# Patient Record
Sex: Female | Born: 1975 | Race: White | Hispanic: No | Marital: Married | Smoking: Never smoker
Health system: Southern US, Community
[De-identification: ages and names within clinical notes are randomized; demographics above are authoritative.]

## PROBLEM LIST (undated history)

## (undated) HISTORY — PX: BREAST REDUCTION SURGERY: SHX8

## (undated) HISTORY — PX: CHOLECYSTECTOMY: SHX55

## (undated) HISTORY — PX: OTHER SURGICAL HISTORY: SHX169

---

## 2004-10-21 ENCOUNTER — Inpatient Hospital Stay: Payer: Self-pay | Admitting: General Surgery

## 2006-02-07 ENCOUNTER — Ambulatory Visit: Payer: Self-pay | Admitting: Family Medicine

## 2006-11-22 IMAGING — US ABDOMEN ULTRASOUND
1 series · 17 of 25 positions shown · non-contrast
Comparison: none

REASON FOR EXAM: epigastric pain      rm 14
COMMENTS:

[Series 1: abdomen ultrasound · 17 of 64 slices shown]
[im 1/64]
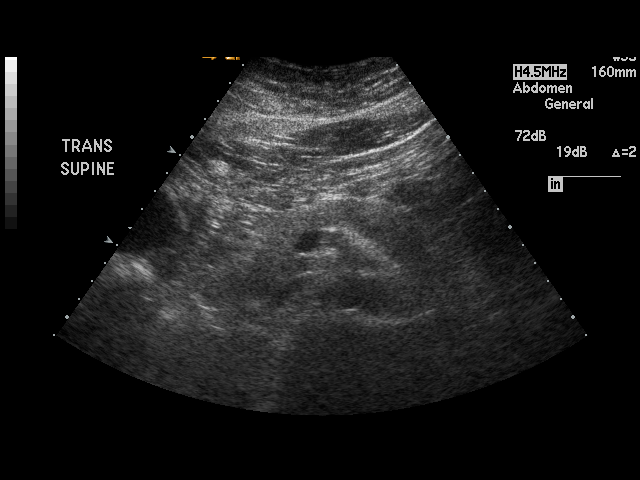
[im 6/64]
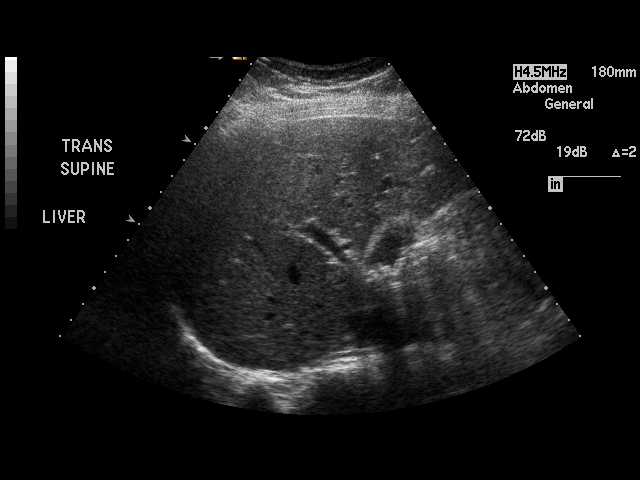
[im 8/64]
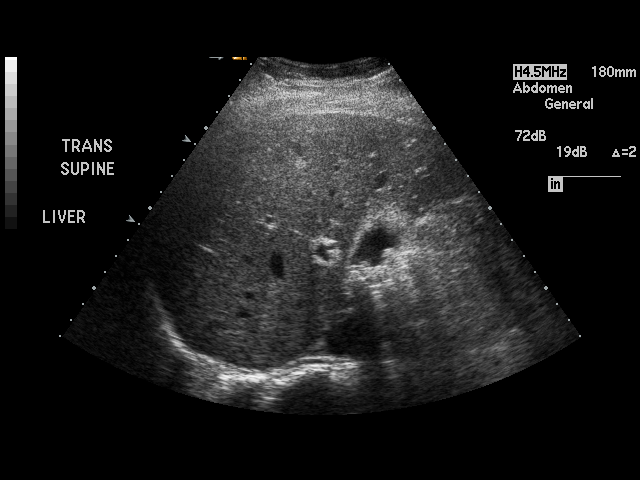
[im 14/64]
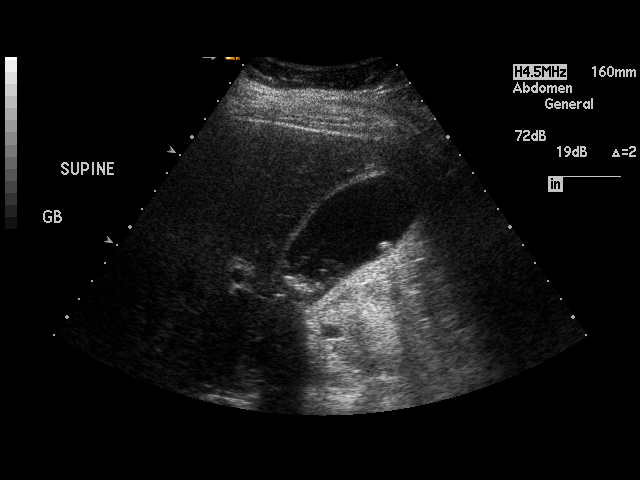
[im 16/64]
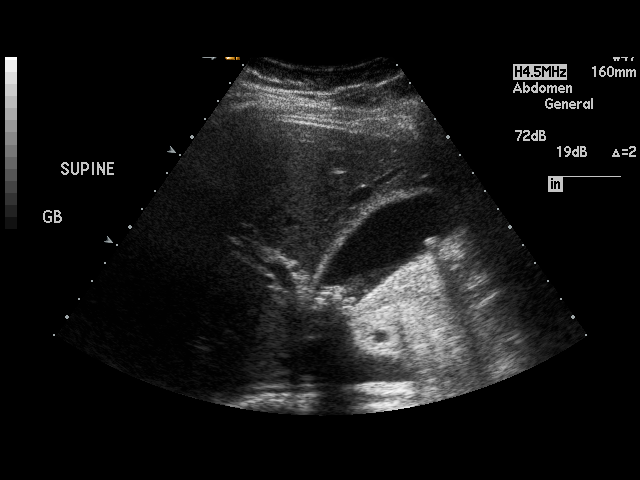
[im 22/64]
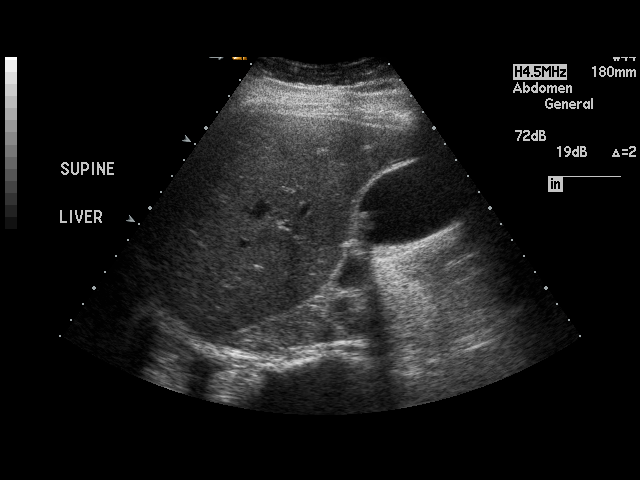
[im 24/64]
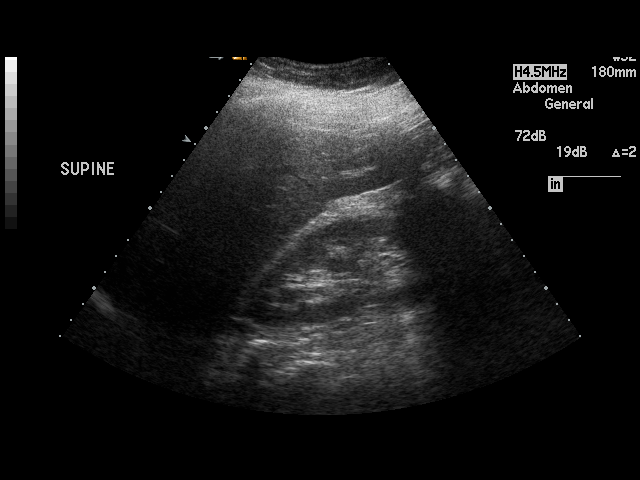
[im 29/64]
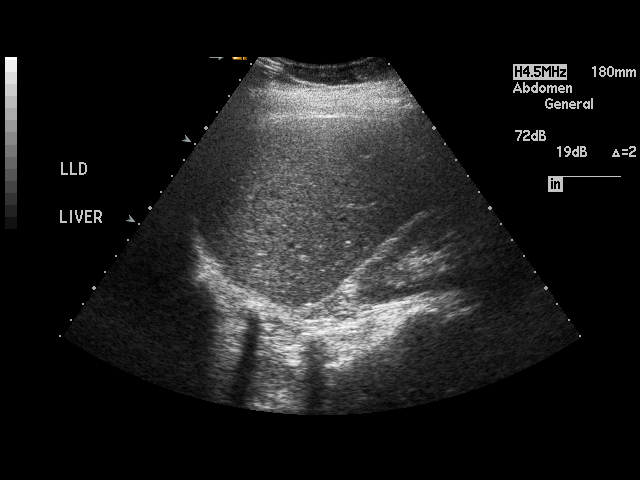
[im 32/64]
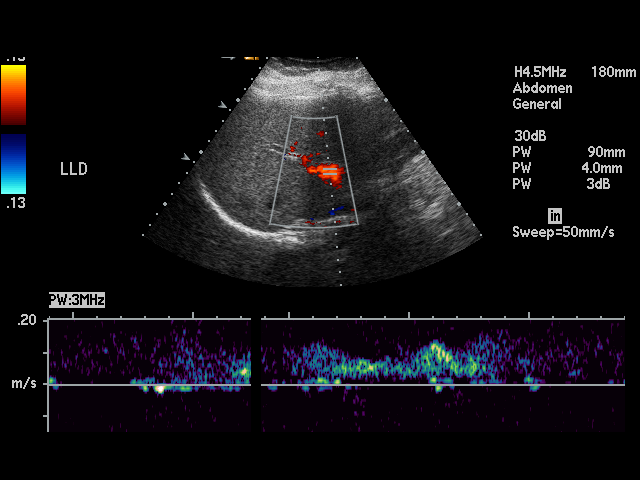
[im 35/64]
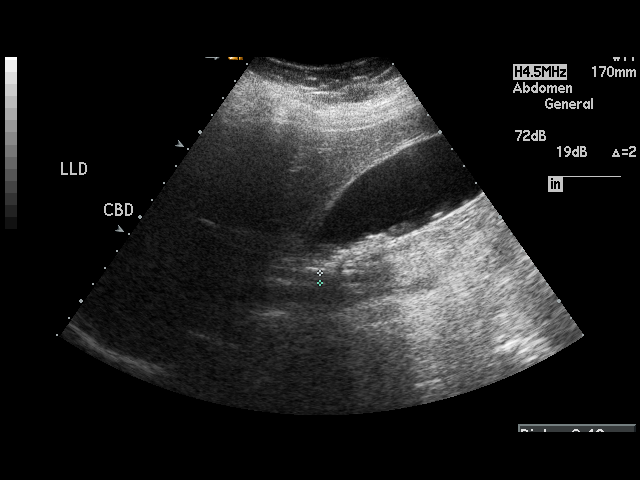
[im 40/64]
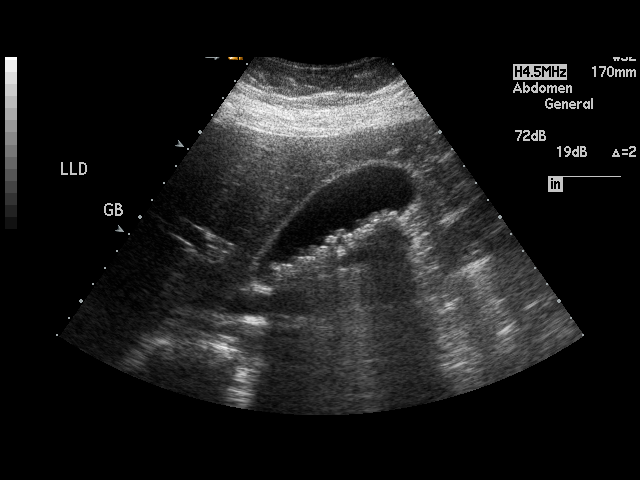
[im 43/64]
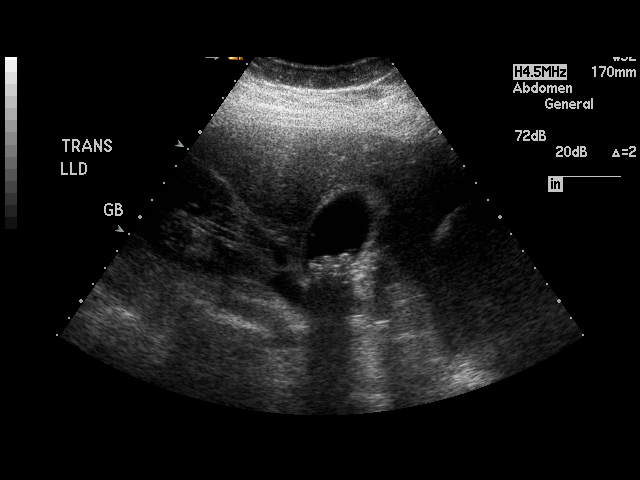
[im 48/64]
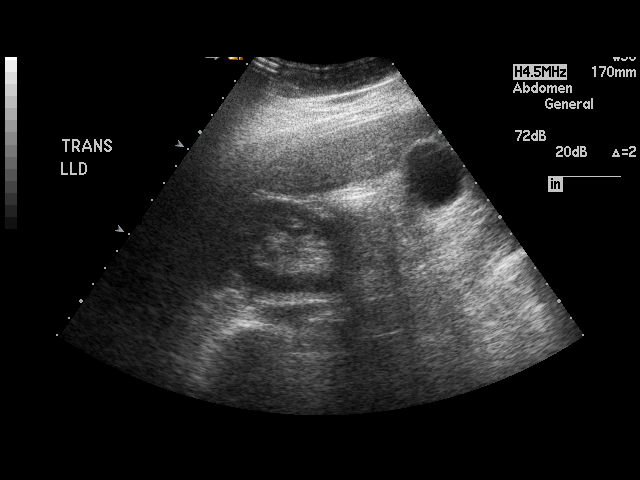
[im 50/64]
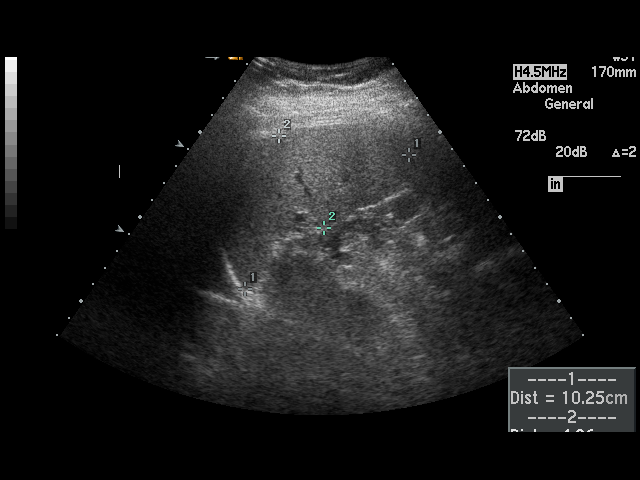
[im 56/64]
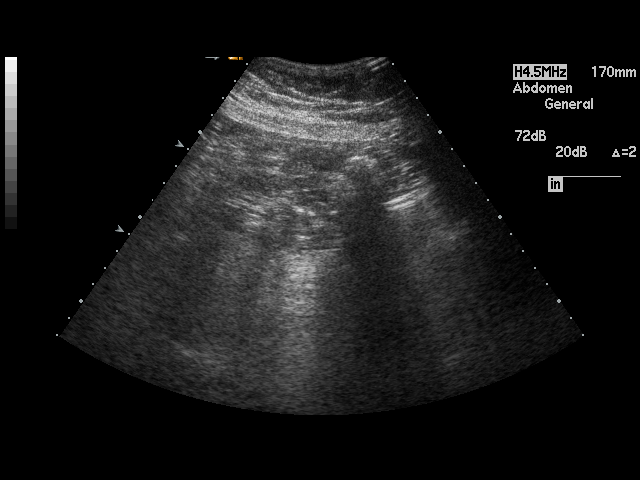
[im 58/64]
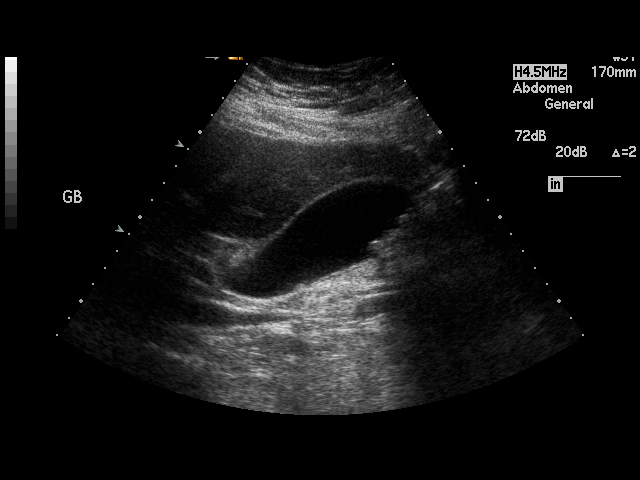
[im 64/64]
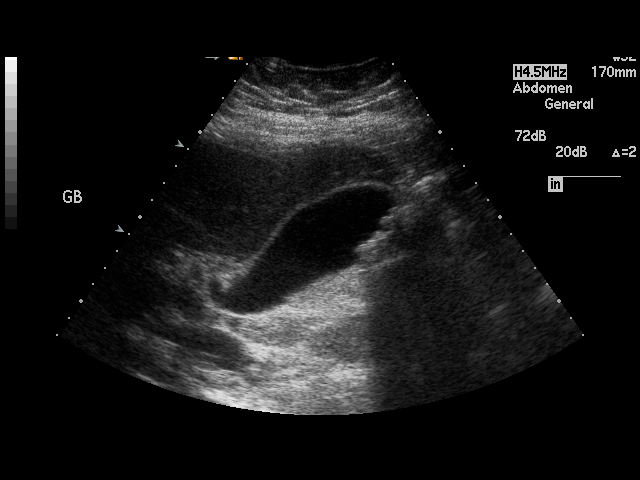

[17 of 25 positions shown; findings below may reference images not displayed]

PROCEDURE:     US  - US ABDOMEN GENERAL SURVEY  - October 21, 2004  [DATE]

RESULT:     The liver, spleen and pancreas are normal in appearance. There
are multiple echo densities in the gallbladder compatible with gallstones.
The gallbladder wall is upper limits to normal in size and thickness or
slightly thickened. The common bile duct measures 4.8 mm in diameter which
is within normal limits. The kidneys show no hydronephrosis. There is no
ascites.
IMPRESSION: 1)Cholelithiasis.

## 2008-01-17 ENCOUNTER — Ambulatory Visit: Payer: Self-pay | Admitting: Family Medicine

## 2008-01-17 DIAGNOSIS — K644 Residual hemorrhoidal skin tags: Secondary | ICD-10-CM | POA: Insufficient documentation

## 2008-01-17 DIAGNOSIS — J309 Allergic rhinitis, unspecified: Secondary | ICD-10-CM | POA: Insufficient documentation

## 2008-07-03 ENCOUNTER — Telehealth (INDEPENDENT_AMBULATORY_CARE_PROVIDER_SITE_OTHER): Payer: Self-pay | Admitting: *Deleted

## 2008-09-23 ENCOUNTER — Ambulatory Visit: Payer: Self-pay | Admitting: Family Medicine

## 2008-09-25 ENCOUNTER — Telehealth: Payer: Self-pay | Admitting: Family Medicine

## 2009-03-12 ENCOUNTER — Telehealth (INDEPENDENT_AMBULATORY_CARE_PROVIDER_SITE_OTHER): Payer: Self-pay | Admitting: Internal Medicine

## 2009-11-20 ENCOUNTER — Encounter: Payer: Self-pay | Admitting: Family Medicine

## 2010-08-31 ENCOUNTER — Ambulatory Visit: Payer: Self-pay | Admitting: Obstetrics and Gynecology

## 2010-09-03 ENCOUNTER — Ambulatory Visit: Payer: Self-pay | Admitting: Obstetrics and Gynecology

## 2010-09-21 NOTE — Letter (Signed)
Summary: [facility]  [facility]   Imported By: Maryln Gottron 11/25/2009 15:47:53  _____________________________________________________________________  External Attachment:    Type:   Image     Comment:   External Document

## 2016-05-19 ENCOUNTER — Ambulatory Visit (INDEPENDENT_AMBULATORY_CARE_PROVIDER_SITE_OTHER): Payer: BLUE CROSS/BLUE SHIELD | Admitting: Podiatry

## 2016-05-19 ENCOUNTER — Encounter: Payer: Self-pay | Admitting: Podiatry

## 2016-05-19 ENCOUNTER — Ambulatory Visit (INDEPENDENT_AMBULATORY_CARE_PROVIDER_SITE_OTHER): Payer: BLUE CROSS/BLUE SHIELD

## 2016-05-19 VITALS — Ht 68.0 in | Wt 188.0 lb

## 2016-05-19 DIAGNOSIS — M2040 Other hammer toe(s) (acquired), unspecified foot: Secondary | ICD-10-CM

## 2016-05-19 DIAGNOSIS — M79675 Pain in left toe(s): Secondary | ICD-10-CM | POA: Diagnosis not present

## 2016-05-19 DIAGNOSIS — R52 Pain, unspecified: Secondary | ICD-10-CM | POA: Diagnosis not present

## 2016-05-19 DIAGNOSIS — M79674 Pain in right toe(s): Secondary | ICD-10-CM | POA: Diagnosis not present

## 2016-05-19 NOTE — Progress Notes (Signed)
   Subjective:    Patient ID: Alexandra KeenMelissa E Garno, female    DOB: Sep 19, 1975, 40 y.o.   MRN: [id number]  HPI    Review of Systems  HENT: Positive for hearing loss.   Allergic/Immunologic: Positive for environmental allergies.       Objective:   Physical Exam        Assessment & Plan:

## 2016-05-22 NOTE — Progress Notes (Signed)
Patient ID: Alexandra Stephenson, female   DOB: 02-22-1976, 40 y.o.   MRN: [id number] Subjective:  40 year old female patient presents today for evaluation of elongated digits with respect of pain to the second digits bilaterally. Patient states that she is always had pain in her second toes. Patient presents today for further treatment and possible surgical intervention and management.    Objective: Physical Exam General: The patient is alert and oriented x3 in no acute distress.  Dermatology: Skin is warm, dry and supple bilateral lower extremities. Negative for open lesions or macerations.  Vascular: Palpable pedal pulses bilaterally. No edema or erythema noted. Capillary refill within normal limits.  Neurological: Epicritic and protective threshold grossly intact bilaterally.   Musculoskeletal Exam: Elongated second digits noted bilaterally protruding past the normal parabola. Pain on palpation to the second digits distally. Hammertoe digit contracture noted to the second digits bilaterally.  Range of motion within normal limits to all pedal and ankle joints bilateral. Muscle strength 5/5 in all groups bilateral.   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 Normal osseous mineralization. Joint spaces preserved. No fracture/dislocation/boney destruction.     Assessment: #1 pain in bilateral toes second digits #2 elongated second digits #3 hammertoe deformity second digits bilateral #4 prominent navicular bilateral, more symptomatic on the right foot  Plan of Care:  #1 Patient was evaluated. #2 discussed the conservative versus surgical management of the elongated second digits of the respective pain and hammertoe deformities. #3 at this time the patient is to consider the surgical option, which would include hammertoe correction with shortening of the second digits bilaterally. #4 patient is to return to clinic on a when necessary  basis  Patient is a pharmacist at [employer]     Dr. Felecia ShellingBrent M. Harrie Cazarez, DPM [facility]

## 2016-06-15 ENCOUNTER — Encounter: Payer: Self-pay | Admitting: Podiatry

## 2016-06-16 ENCOUNTER — Telehealth (INDEPENDENT_AMBULATORY_CARE_PROVIDER_SITE_OTHER): Payer: Self-pay

## 2016-06-16 NOTE — Telephone Encounter (Signed)
Patient called wanting to know more about a procedure that Dr. Wyn Quakerew discussed with her 4 years ago. Dr. Wyn Quakerew discussed her having laser ablation and foam sclerotherapy, she was wondering if she could possibly get this done next year after her insurance changed. I let her know that we could get her in to see Dr. Wyn Quakerew and go from there to schedule any procedures per what the doctor ordered.

## 2016-06-20 ENCOUNTER — Telehealth: Payer: Self-pay | Admitting: *Deleted

## 2016-06-20 NOTE — Telephone Encounter (Signed)
"  I was contacting you to schedule a surgery with Dr. Logan BoresEvans.  If you would, give me a call back."

## 2016-06-20 NOTE — Telephone Encounter (Signed)
"  I've called several times to schedule my surgery."  I apologize for the wait.  "Oh, it is not a problem.  I want to go ahead and set up surgery for February 15.  I don't know if you are scheduling out that far or not."  Have you signed consent forms?  "No, I guess I am a little confused.  He said for me to call when I decided whether or not I wanted surgery."  I read his note.  He wanted you to schedule an appointment to see him once you decided whether or not you want to proceed.  I can transfer you to a scheduler if you like.  "Is it possible for me to go to the [place] office.  [facility] to get [facility] sooner."  I'll transfer you to a scheduler.

## 2016-07-08 ENCOUNTER — Ambulatory Visit (INDEPENDENT_AMBULATORY_CARE_PROVIDER_SITE_OTHER): Payer: BLUE CROSS/BLUE SHIELD | Admitting: Podiatry

## 2016-07-08 ENCOUNTER — Telehealth: Payer: Self-pay | Admitting: *Deleted

## 2016-07-08 DIAGNOSIS — M79674 Pain in right toe(s): Secondary | ICD-10-CM

## 2016-07-08 DIAGNOSIS — Q742 Other congenital malformations of lower limb(s), including pelvic girdle: Secondary | ICD-10-CM

## 2016-07-08 DIAGNOSIS — R52 Pain, unspecified: Secondary | ICD-10-CM

## 2016-07-08 DIAGNOSIS — M79675 Pain in left toe(s): Secondary | ICD-10-CM

## 2016-07-08 NOTE — Patient Instructions (Signed)
Pre-Operative Instructions  Congratulations, you have decided to take an important step to improving your quality of life.  You can be assured that the doctors of [facility] will be with you every step of the way.  1. Plan to be at the surgery center/hospital at least 1 (one) hour prior to your scheduled time unless otherwise directed by the surgical center/hospital staff.  You must have a responsible adult accompany you, remain during the surgery and drive you home.  Make sure you have directions to the surgical center/hospital and know how to get there on time. 2. For hospital based surgery you will need to obtain a history and physical form from your family physician within 1 month prior to the date of surgery- we will give you a form for you primary physician.  3. We make every effort to accommodate the date you request for surgery.  There are however, times where surgery dates or times have to be moved.  We will contact you as soon as possible if a change in schedule is required.   4. No Aspirin/Ibuprofen for one week before surgery.  If you are on aspirin, any non-steroidal anti-inflammatory medications (Mobic, Aleve, Ibuprofen) you should stop taking it 7 days prior to your surgery.  You make take Tylenol  For pain prior to surgery.  5. Medications- If you are taking daily heart and blood pressure medications, seizure, reflux, allergy, asthma, anxiety, pain or diabetes medications, make sure the surgery center/hospital is aware before the day of surgery so they may notify you which medications to take or avoid the day of surgery. 6. No food or drink after midnight the night before surgery unless directed otherwise by surgical center/hospital staff. 7. No alcoholic beverages 24 hours prior to surgery.  No smoking 24 hours prior to or 24 hours after surgery. 8. Wear loose pants or shorts- loose enough to fit over bandages, boots, and casts. 9. No slip on shoes, sneakers are best. 10. Bring  your boot with you to the surgery center/hospital.  Also bring crutches or a walker if your physician has prescribed it for you.  If you do not have this equipment, it will be provided for you after surgery. 11. If you have not been contracted by the surgery center/hospital by the day before your surgery, call to confirm the date and time of your surgery. 12. Leave-time from work may vary depending on the type of surgery you have.  Appropriate arrangements should be made prior to surgery with your employer. 13. Prescriptions will be provided immediately following surgery by your doctor.  Have these filled as soon as possible after surgery and take the medication as directed. 14. Remove nail polish on the operative foot. 15. Wash the night before surgery.  The night before surgery wash the foot and leg well with the antibacterial soap provided and water paying special attention to beneath the toenails and in between the toes.  Rinse thoroughly with water and dry well with a towel.  Perform this wash unless told not to do so by your physician.  Enclosed: 1 Ice pack (please put in freezer the night before surgery)   1 Hibiclens skin cleaner   Pre-op Instructions  If you have any questions regarding the instructions, do not hesitate to call our office.  [place]: [address] [place], [place] [address] [phone]  [place]: [address] [place], [place] [address] [phone]  [place]: [address]  [place], [place] [address] [phone]   Dr.   Norman Regal DPM, Dr. Matthew Wagoner DPM, Dr. M. Todd Hyatt DPM, Dr. Titorya Stover DPM 

## 2016-07-08 NOTE — Telephone Encounter (Signed)
"  I was just contacting you to set up a surgery date.  Please give me a call."

## 2016-07-12 NOTE — Telephone Encounter (Signed)
"  I spoke to you before and discussed scheduling surgery and you said I needed to see Dr. Logan BoresEvans first for a consult.  I just saw him recently.  I would like to schedule."  "When would you like to schedule?  I would like to do it on February 15."  I will get it scheduled.  "I have some papers I need filled out.  Do I bring those to you or do I take them to the [place] office?"  You can take them to the [place] office.

## 2016-07-24 NOTE — Progress Notes (Signed)
Patient ID: Alexandra Stephenson, female   DOB: 02-02-1976, 40 y.o.   MRN: [id number] Subjective:  40 year old female patient presents today for evaluation of elongated digits with respect of pain to the second digits bilaterally. Patient states that she is always had pain in her second toes. Patient presents today for further treatment and possible surgical intervention and management.    Objective: Physical Exam General: The patient is alert and oriented x3 in no acute distress.  Dermatology: Skin is warm, dry and supple bilateral lower extremities. Negative for open lesions or macerations.  Vascular: Palpable pedal pulses bilaterally. No edema or erythema noted. Capillary refill within normal limits.  Neurological: Epicritic and protective threshold grossly intact bilaterally.   Musculoskeletal Exam: Elongated second digits noted bilaterally protruding past the normal parabola. Pain on palpation to the second digits distally. Hammertoe digit contracture noted to the second digits bilaterally.  Range of motion within normal limits to all pedal and ankle joints bilateral. Muscle strength 5/5 in all groups bilateral.   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 Normal osseous mineralization. Joint spaces preserved. No fracture/dislocation/boney destruction.     Assessment: #1 pain in bilateral toes second digits #2 elongated second digits #3 hammertoe deformity second digits bilateral #4 prominent navicular bilateral, more symptomatic on the right foot  Plan of Care:  #1 Patient was evaluated. #2 discussed the conservative versus surgical management of the elongated second digits of the respective pain and hammertoe deformities. #3 patient opts for surgical correction. All risks and possible complications associated with the procedure were explained in detail. All patient questions were answered. No guarantees were expressed or  implied. #4 surgery will consist of PIPJ arthroplasty second digits bilateral. #5 postoperative shoes dispensed bilaterally #6 authorization for surgery initiated today #7 return to clinic 1 week postop  Patient is a pharmacist at [employer]     Dr. Felecia ShellingBrent M. Evans, DPM [facility]

## 2016-08-02 DIAGNOSIS — M79673 Pain in unspecified foot: Secondary | ICD-10-CM

## 2016-09-30 ENCOUNTER — Ambulatory Visit (INDEPENDENT_AMBULATORY_CARE_PROVIDER_SITE_OTHER): Payer: BLUE CROSS/BLUE SHIELD | Admitting: Vascular Surgery

## 2016-09-30 ENCOUNTER — Encounter (INDEPENDENT_AMBULATORY_CARE_PROVIDER_SITE_OTHER): Payer: Self-pay | Admitting: Vascular Surgery

## 2016-09-30 VITALS — BP 121/73 | HR 77 | Resp 18 | Ht 68.5 in | Wt 197.4 lb

## 2016-09-30 DIAGNOSIS — M7989 Other specified soft tissue disorders: Secondary | ICD-10-CM

## 2016-09-30 DIAGNOSIS — I83813 Varicose veins of bilateral lower extremities with pain: Secondary | ICD-10-CM | POA: Diagnosis not present

## 2016-09-30 NOTE — Progress Notes (Signed)
Patient ID: Alexandra Stephenson, female   DOB: [birth year], 41 y.o.   MRN: [id number]  Chief Complaint  Patient presents with  . New Patient (Initial Visit)    HPI Alexandra Stephenson is a 41 y.o. female.   The patient presents with complaints of symptomatic varicosities of the Lower extremities. The patient reports a long standing history of varicosities and they have become painful over time. She was seen about 5 years ago but was unable to proceed with all of her venous treatment secondary to financial issues. Since that time, she has just gradually gotten worse. There was no clear inciting event or causative factor that started the symptoms.  The left leg is more severly affected. The patient elevates the legs for relief. The pain is described as stinging and tingling discomfort in the lower leg. The symptoms are generally most severe in the evening, particularly when they have been on their feet for long periods of time. She works 12 hours a day and has to stand for much of that time. Compression stockings has been used to try to improve the symptoms with some success. The patient complains of regular significant swelling as an associated symptom. Again, the left leg is more significant in terms of the swelling. The patient has no previous history of deep venous thrombosis or superficial thrombophlebitis to their knowledge.     History reviewed. No pertinent past medical history.  Past Surgical History:  Procedure Laterality Date  . BREAST REDUCTION SURGERY Bilateral   . CHOLECYSTECTOMY    . tummy tuck      Family History  Problem Relation Age of Onset  . Hyperlipidemia Mother   . Hypertension Mother   . Varicose Veins Mother   . Heart attack Father   . Hyperlipidemia Father   . Hypertension Father   . Hyperlipidemia Maternal Grandmother   . Hypertension Maternal Grandmother   . Varicose Veins Maternal Grandmother   . Congestive Heart Failure Maternal Grandfather   .  Hyperlipidemia Maternal Grandfather   . Hypertension Maternal Grandfather      Social History Social History  Substance Use Topics  . Smoking status: Never Smoker  . Smokeless tobacco: Never Used  . Alcohol use No  No IV drug use  No Known Allergies  Current Outpatient Prescriptions  Medication Sig Dispense Refill  . calcium-vitamin D (SM CALCIUM 500/VITAMIN D3) 500-400 MG-UNIT tablet Take by mouth.    . docusate sodium (COLACE) 100 MG capsule Take 100 mg by mouth 2 (two) times daily.    . finasteride (PROSCAR) 5 MG tablet     . fluticasone (FLONASE) 50 MCG/ACT nasal spray     . glucosamine-chondroitin 500-400 MG tablet Take 1 tablet by mouth daily.    Marland Kitchen. spironolactone (ALDACTONE) 100 MG tablet     . tretinoin (ALTRALIN) 0.05 % gel      No current facility-administered medications for this visit.       REVIEW OF SYSTEMS (Negative unless checked)  Constitutional: [] Weight loss  [] Fever  [] Chills Cardiac: [] Chest pain   [] Chest pressure   [] Palpitations   [] Shortness of breath when laying flat   [] Shortness of breath at rest   [] Shortness of breath with exertion. Vascular:  [] Pain in legs with walking   [x] Pain in legs at rest   [] Pain in legs when laying flat   [] Claudication   [] Pain in feet when walking  [] Pain in feet at rest  [] Pain in feet when laying flat   []   History of DVT   [] Phlebitis   [x] Swelling in legs   [x] Varicose veins   [] Non-healing ulcers Pulmonary:   [] Uses home oxygen   [] Productive cough   [] Hemoptysis   [] Wheeze  [] COPD   [] Asthma Neurologic:  [] Dizziness  [] Blackouts   [] Seizures   [] History of stroke   [] History of TIA  [] Aphasia   [] Temporary blindness   [] Dysphagia   [] Weakness or numbness in arms   [] Weakness or numbness in legs Musculoskeletal:  [] Arthritis   [] Joint swelling   [] Joint pain   [] Low back pain Hematologic:  [] Easy bruising  [] Easy bleeding   [] Hypercoagulable state   [] Anemic  [] Hepatitis Gastrointestinal:  [] Blood in stool    [] Vomiting blood  [] Gastroesophageal reflux/heartburn   [] Abdominal pain Genitourinary:  [] Chronic kidney disease   [] Difficult urination  [] Frequent urination  [] Burning with urination   [] Hematuria Skin:  [] Rashes   [] Ulcers   [] Wounds Psychological:  [] History of anxiety   []  History of major depression.    Physical Exam BP 121/73   Pulse 77   Resp 18   Ht 5' 8.5" (1.74 m)   Wt 89.5 kg (197 lb 6.4 oz)   BMI 29.58 kg/m  Gen:  WD/WN, NAD Head: [place]/AT, No temporalis wasting.  Ear/Nose/Throat: Hearing grossly intact, dentition Good Eyes: Sclera non-icteric. Conjunctiva clear Neck: Supple, no nuchal rigidity. Trachea midline Pulmonary:  Good air movement, no use of accessory muscles, respirations not labored.  Cardiac: RRR, No JVD Vascular: Varicosities extensive and measuring up to 3-4 mm in the right lower extremity        Varicosities diffuse and measuring up to 2 mm in the left lower extremity Vessel Right Left  Radial Palpable Palpable  Ulnar Palpable Palpable  Brachial Palpable Palpable  Carotid Palpable, without bruit Palpable, without bruit  Aorta Not palpable N/A  Femoral Palpable Palpable  Popliteal Palpable Palpable  PT Palpable Palpable  DP Palpable Palpable   Gastrointestinal: soft, non-tender/non-distended. No guarding/reflex. No masses, surgical incisions, or scars. Musculoskeletal: M/S 5/5 throughout.   No RLE edema.  Trace LLE edema Neurologic: Sensation grossly intact in extremities.  Symmetrical.  Speech is fluent.  Psychiatric: Judgment intact, Mood & affect appropriate for pt's clinical situation. Dermatologic: No rashes or ulcers noted.  No cellulitis or open wounds. Lymph : No Cervical, Axillary, or Inguinal lymphadenopathy.   Radiology No results found.  Labs No results found for this or any previous visit (from the past 2160 hour(s)).  Assessment/Plan:  Swelling of limb Likely secondary to venous disease. Compression stockings, elevation, and  increasing walking.  Varicose veins of leg with pain, bilateral see treatment plan below    The patient has symptoms consistent with chronic venous insufficiency. We discussed the natural history and treatment options for venous disease. I recommended the regular use of 20 - 30 mm Hg compression stockings, and prescribed these today. I recommended leg elevation and anti-inflammatories as needed for pain. I have also recommended a complete venous duplex to assess the venous system for reflux or thrombotic issues. This can be done at the patient's convenience. I will see the patient back in 3 months to assess the response to conservative management, and determine further treatment options.     Festus Barren 09/30/2016, 9:34 AM   This note was created with Dragon medical transcription system.  Any errors from dictation are unintentional.

## 2016-09-30 NOTE — Assessment & Plan Note (Signed)
Likely secondary to venous disease. Compression stockings, elevation, and increasing walking.

## 2016-09-30 NOTE — Assessment & Plan Note (Addendum)
see treatment plan below

## 2016-09-30 NOTE — Patient Instructions (Signed)
Venous Stasis or Chronic Venous Insufficiency Chronic venous insufficiency, also called venous stasis, is a condition that affects the veins in the legs. The condition prevents blood from being pumped through these veins effectively. Blood may no longer be pumped effectively from the legs back to the heart. This condition can range from mild to severe. With proper treatment, you should be able to continue with an active life. CAUSES  Chronic venous insufficiency occurs when the vein walls become stretched, weakened, or damaged or when valves within the vein are damaged. Some common causes of this include:  High blood pressure inside the veins (venous hypertension).  Increased blood pressure in the leg veins from long periods of sitting or standing.  A blood clot that blocks blood flow in a vein (deep vein thrombosis).  Inflammation of a superficial vein (phlebitis) that causes a blood clot to form. RISK FACTORS Various things can make you more likely to develop chronic venous insufficiency, including:  Family history of this condition.  Obesity.  Pregnancy.  Sedentary lifestyle.  Smoking.  Jobs requiring long periods of standing or sitting in one place.  Being a certain age. Women in their 40s and 50s and men in their 70s are more likely to develop this condition. SIGNS AND SYMPTOMS  Symptoms may include:   Varicose veins.  Skin breakdown or ulcers.  Reddened or discolored skin on the leg.  Brown, smooth, tight, and painful skin just above the ankle, usually on the inside surface (lipodermatosclerosis).  Swelling. DIAGNOSIS  To diagnose this condition, your health care provider will take a medical history and do a physical exam. The following tests may be ordered to confirm the diagnosis:  Duplex ultrasound-A procedure that produces a picture of a blood vessel and nearby organs and also provides information on blood flow through the blood vessel.  Plethysmography-A  procedure that tests blood flow.  A venogram, or venography-A procedure used to look at the veins using X-ray and dye. TREATMENT The goals of treatment are to help you return to an active life and to minimize pain or disability. Treatment will depend on the severity of the condition. Medical procedures may be needed for severe cases. Treatment options may include:   Use of compression stockings. These can help with symptoms and lower the chances of the problem getting worse, but they do not cure the problem.  Sclerotherapy-A procedure involving an injection of a material that "dissolves" the damaged veins. Other veins in the network of blood vessels take over the function of the damaged veins.  Surgery to remove the vein or cut off blood flow through the vein (vein stripping or laser ablation surgery).  Surgery to repair a valve. HOME CARE INSTRUCTIONS   Wear compression stockings as directed by your health care provider.  Only take over-the-counter or prescription medicines for pain, discomfort, or fever as directed by your health care provider.  Follow up with your health care provider as directed. SEEK MEDICAL CARE IF:   You have redness, swelling, or increasing pain in the affected area.  You see a red streak or line that extends up or down from the affected area.  You have a breakdown or loss of skin in the affected area, even if the breakdown is small.  You have an injury to the affected area. SEEK IMMEDIATE MEDICAL CARE IF:   You have an injury and open wound in the affected area.  Your pain is severe and does not improve with medicine.  You have   sudden numbness or weakness in the foot or ankle below the affected area, or you have trouble moving your foot or ankle.  You have a fever or persistent symptoms for more than 2-3 days.  You have a fever and your symptoms suddenly get worse. MAKE SURE YOU:   Understand these instructions.  Will watch your condition.  Will  get help right away if you are not doing well or get worse. This information is not intended to replace advice given to you by your health care provider. Make sure you discuss any questions you have with your health care provider. Document Released: 12/12/2006 Document Revised: 05/29/2013 Document Reviewed: 04/15/2013 Elsevier Interactive Patient Education  2017 [employer]  

## 2016-10-06 ENCOUNTER — Encounter: Payer: Self-pay | Admitting: Podiatry

## 2016-10-06 DIAGNOSIS — M2042 Other hammer toe(s) (acquired), left foot: Secondary | ICD-10-CM | POA: Diagnosis not present

## 2016-10-06 DIAGNOSIS — M7751 Other enthesopathy of right foot: Secondary | ICD-10-CM | POA: Diagnosis not present

## 2016-10-06 DIAGNOSIS — M7752 Other enthesopathy of left foot: Secondary | ICD-10-CM | POA: Diagnosis not present

## 2016-10-06 DIAGNOSIS — M2041 Other hammer toe(s) (acquired), right foot: Secondary | ICD-10-CM | POA: Diagnosis not present

## 2016-10-14 ENCOUNTER — Ambulatory Visit (INDEPENDENT_AMBULATORY_CARE_PROVIDER_SITE_OTHER): Payer: BLUE CROSS/BLUE SHIELD

## 2016-10-14 ENCOUNTER — Ambulatory Visit (INDEPENDENT_AMBULATORY_CARE_PROVIDER_SITE_OTHER): Payer: Self-pay | Admitting: Podiatry

## 2016-10-14 DIAGNOSIS — M2042 Other hammer toe(s) (acquired), left foot: Secondary | ICD-10-CM

## 2016-10-14 DIAGNOSIS — M2041 Other hammer toe(s) (acquired), right foot: Secondary | ICD-10-CM

## 2016-10-14 DIAGNOSIS — Z9889 Other specified postprocedural states: Secondary | ICD-10-CM

## 2016-10-19 NOTE — Progress Notes (Signed)
DOS 02.15.2018 Hammertoe repair bilateral second digits.

## 2016-10-21 ENCOUNTER — Ambulatory Visit (INDEPENDENT_AMBULATORY_CARE_PROVIDER_SITE_OTHER): Payer: Self-pay | Admitting: Podiatry

## 2016-10-21 DIAGNOSIS — Z9889 Other specified postprocedural states: Secondary | ICD-10-CM

## 2016-10-21 DIAGNOSIS — M2041 Other hammer toe(s) (acquired), right foot: Secondary | ICD-10-CM

## 2016-10-21 DIAGNOSIS — M2042 Other hammer toe(s) (acquired), left foot: Secondary | ICD-10-CM

## 2016-10-21 NOTE — Progress Notes (Signed)
Subjective: Patient presents today status post bilateral second digit hammertoe correction. Date of surgery 10/06/2016. Patient states that she's doing well and denies fever and chills.  Objective: Skin incisions are well coapted with sutures intact. No sign of infectious process noted area minimal edema noted. Radiographic exam demonstrates intact percutaneous K wires with stable osteotomies. Next  Assessment: Status post bilateral hammertoe correction second digits date of surgery 10/06/2016  Plan of care: Today the patient was evaluated. Dressings were changed. Return to clinic in 1 week for possible suture removal.

## 2016-10-27 ENCOUNTER — Encounter: Payer: Self-pay | Admitting: Podiatry

## 2016-10-31 ENCOUNTER — Ambulatory Visit (INDEPENDENT_AMBULATORY_CARE_PROVIDER_SITE_OTHER): Payer: Self-pay | Admitting: Podiatry

## 2016-10-31 ENCOUNTER — Ambulatory Visit (INDEPENDENT_AMBULATORY_CARE_PROVIDER_SITE_OTHER): Payer: BLUE CROSS/BLUE SHIELD

## 2016-10-31 DIAGNOSIS — M2041 Other hammer toe(s) (acquired), right foot: Secondary | ICD-10-CM

## 2016-10-31 DIAGNOSIS — M2040 Other hammer toe(s) (acquired), unspecified foot: Secondary | ICD-10-CM | POA: Diagnosis not present

## 2016-10-31 DIAGNOSIS — M2042 Other hammer toe(s) (acquired), left foot: Principal | ICD-10-CM

## 2016-10-31 DIAGNOSIS — Z9889 Other specified postprocedural states: Secondary | ICD-10-CM

## 2016-10-31 NOTE — Progress Notes (Signed)
Subjective: Patient presents today status post bilateral second digit hammertoe correction. Date of surgery 10/06/2016. Patient states that she's doing well and denies fever and chills. Patient is concerned today regarding the rotation of her second digit. She states that her toes twist occasionally on the percutaneous fixation her  Objective: Skin incisions are well coapted. No sign of infectious process noted. minimal edema noted.   Assessment: Status post bilateral hammertoe correction second digits date of surgery 10/06/2016  Plan of care: Today the patient was evaluated. Percutaneous fixation pins were removed today. Patient can begin showering in 2 days. Begin transition from the postoperative shoes into good supportive tennis shoes. Return to clinic in 2 weeks

## 2016-11-03 NOTE — Progress Notes (Signed)
Subjective: Patient presents today status post bilateral second digit hammertoe correction. Date of surgery 10/06/2016. Patient states that she's doing well and denies fever and chills.  Objective: Skin incisions are well coapted with sutures intact. No sign of infectious process noted area minimal edema noted. Radiographic exam demonstrates intact percutaneous K wires with stable osteotomies. Next  Assessment: Status post bilateral hammertoe correction second digits date of surgery 10/06/2016  Plan of care: Patient was evaluated. Sutures removed. Return to clinic in 2 weeks for percutaneous fixation pin removal.  Felecia ShellingBrent M. Evans, DPM [facility]  Dr. Felecia ShellingBrent M. Evans, DPM    [address]                                        [place], [place] [address]                Office [phone]  Fax [phone]

## 2016-11-04 ENCOUNTER — Encounter: Payer: BLUE CROSS/BLUE SHIELD | Admitting: Podiatry

## 2016-11-08 ENCOUNTER — Ambulatory Visit (INDEPENDENT_AMBULATORY_CARE_PROVIDER_SITE_OTHER): Payer: BLUE CROSS/BLUE SHIELD

## 2016-11-08 ENCOUNTER — Ambulatory Visit (INDEPENDENT_AMBULATORY_CARE_PROVIDER_SITE_OTHER): Payer: Self-pay | Admitting: Podiatry

## 2016-11-08 DIAGNOSIS — M2042 Other hammer toe(s) (acquired), left foot: Secondary | ICD-10-CM

## 2016-11-08 DIAGNOSIS — M2040 Other hammer toe(s) (acquired), unspecified foot: Secondary | ICD-10-CM | POA: Diagnosis not present

## 2016-11-08 DIAGNOSIS — M2041 Other hammer toe(s) (acquired), right foot: Secondary | ICD-10-CM

## 2016-11-08 DIAGNOSIS — Z9889 Other specified postprocedural states: Secondary | ICD-10-CM

## 2016-11-08 MED ORDER — NONFORMULARY OR COMPOUNDED ITEM: 2 refills | Status: AC

## 2016-11-15 NOTE — Progress Notes (Signed)
Subjective: Patient presents today status post bilateral hammertoe correction second digits. Date of surgery 10/06/2016. Patient states the toes are still little bit swollen. They also feel somewhat unstable.  Objective: Skin incisions of healed. Minimal edema noted locally to the second digits bilateral. Radiographic exam demonstrates stable surgical sites.  Assessment: Status post hammertoe correction second digits bilateral. Date of surgery 10/06/2016.  Plan of care: Today the patient was evaluated. Patient can begin range of motion exercises to the second MPJ bilateral. Instructions and demonstrations were provided. Return to clinic in 3 months  Felecia ShellingBrent M. Evans, DPM [facility]  Dr. Felecia ShellingBrent M. Evans, DPM    [address]                                        [place], [place] [address]                Office [phone]  Fax [phone]

## 2016-12-30 ENCOUNTER — Encounter (INDEPENDENT_AMBULATORY_CARE_PROVIDER_SITE_OTHER): Payer: BLUE CROSS/BLUE SHIELD

## 2016-12-30 ENCOUNTER — Ambulatory Visit (INDEPENDENT_AMBULATORY_CARE_PROVIDER_SITE_OTHER): Payer: BLUE CROSS/BLUE SHIELD | Admitting: Vascular Surgery

## 2017-01-03 ENCOUNTER — Ambulatory Visit (INDEPENDENT_AMBULATORY_CARE_PROVIDER_SITE_OTHER): Payer: BLUE CROSS/BLUE SHIELD | Admitting: Vascular Surgery

## 2017-01-03 ENCOUNTER — Encounter (INDEPENDENT_AMBULATORY_CARE_PROVIDER_SITE_OTHER): Payer: Self-pay | Admitting: Vascular Surgery

## 2017-01-03 ENCOUNTER — Ambulatory Visit (INDEPENDENT_AMBULATORY_CARE_PROVIDER_SITE_OTHER): Payer: BLUE CROSS/BLUE SHIELD

## 2017-01-03 VITALS — BP 132/89 | HR 82 | Resp 15 | Ht 68.5 in | Wt 209.0 lb

## 2017-01-03 DIAGNOSIS — M7989 Other specified soft tissue disorders: Secondary | ICD-10-CM

## 2017-01-03 DIAGNOSIS — I83813 Varicose veins of bilateral lower extremities with pain: Secondary | ICD-10-CM

## 2017-01-03 NOTE — Assessment & Plan Note (Signed)
Present despite conservative measures. See treatment plan as below. 

## 2017-01-03 NOTE — Assessment & Plan Note (Signed)
Present despite conservative measures. See treatment plan as below.

## 2017-01-03 NOTE — Progress Notes (Signed)
Patient ID: Alexandra Stephenson, female   DOB: [birth year], 41 y.o.   MRN: [id number]  Chief Complaint  Patient presents with  . Ultrasound follow up    3 month     HPI Alexandra Stephenson is a 41 y.o. female.  Patient returns in follow up of their venous disease.  They have done their best to comply with the prescribed conservative therapies of compression stockings, leg elevation, exercise, and still requires anti-inflammatories for discomfort and has symptoms that are persistent and bothersome on a daily basis, affecting their activities of daily living and normal activities.  The venous reflux study demonstrates Bilateral great saphenous vein reflux as well as reflux in both femoral venous systems as well.Marland Kitchen     History reviewed. No pertinent past medical history.       Past Surgical History:  Procedure Laterality Date  . BREAST REDUCTION SURGERY Bilateral   . CHOLECYSTECTOMY    . tummy tuck           Family History  Problem Relation Age of Onset  . Hyperlipidemia Mother   . Hypertension Mother   . Varicose Veins Mother   . Heart attack Father   . Hyperlipidemia Father   . Hypertension Father   . Hyperlipidemia Maternal Grandmother   . Hypertension Maternal Grandmother   . Varicose Veins Maternal Grandmother   . Congestive Heart Failure Maternal Grandfather   . Hyperlipidemia Maternal Grandfather   . Hypertension Maternal Grandfather      Social History     Social History  Substance Use Topics  . Smoking status: Never Smoker  . Smokeless tobacco: Never Used  . Alcohol use No  No IV drug use  No Known Allergies        Current Outpatient Prescriptions  Medication Sig Dispense Refill  . calcium-vitamin D (SM CALCIUM 500/VITAMIN D3) 500-400 MG-UNIT tablet Take by mouth.    . docusate sodium (COLACE) 100 MG capsule Take 100 mg by mouth 2 (two) times daily.    . finasteride (PROSCAR) 5 MG tablet     . fluticasone (FLONASE) 50  MCG/ACT nasal spray     . glucosamine-chondroitin 500-400 MG tablet Take 1 tablet by mouth daily.    Marland Kitchen spironolactone (ALDACTONE) 100 MG tablet     . tretinoin (ALTRALIN) 0.05 % gel      No current facility-administered medications for this visit.       REVIEW OF SYSTEMS (Negative unless checked)  Constitutional: [] Weight loss  [] Fever  [] Chills Cardiac: [] Chest pain   [] Chest pressure   [] Palpitations   [] Shortness of breath when laying flat   [] Shortness of breath at rest   [] Shortness of breath with exertion. Vascular:  [] Pain in legs with walking   [x] Pain in legs at rest   [] Pain in legs when laying flat   [] Claudication   [] Pain in feet when walking  [] Pain in feet at rest  [] Pain in feet when laying flat   [] History of DVT   [] Phlebitis   [x] Swelling in legs   [x] Varicose veins   [] Non-healing ulcers Pulmonary:   [] Uses home oxygen   [] Productive cough   [] Hemoptysis   [] Wheeze  [] COPD   [] Asthma Neurologic:  [] Dizziness  [] Blackouts   [] Seizures   [] History of stroke   [] History of TIA  [] Aphasia   [] Temporary blindness   [] Dysphagia   [] Weakness or numbness in arms   [] Weakness or numbness in legs Musculoskeletal:  [] Arthritis   [] Joint swelling   []   Joint pain   [] Low back pain Hematologic:  [] Easy bruising  [] Easy bleeding   [] Hypercoagulable state   [] Anemic  [] Hepatitis Gastrointestinal:  [] Blood in stool   [] Vomiting blood  [] Gastroesophageal reflux/heartburn   [] Abdominal pain Genitourinary:  [] Chronic kidney disease   [] Difficult urination  [] Frequent urination  [] Burning with urination   [] Hematuria Skin:  [] Rashes   [] Ulcers   [] Wounds Psychological:  [] History of anxiety   []  History of major depression.     Physical Exam BP 132/89 (BP Location: Right Arm)   Pulse 82   Resp 15   Ht 5' 8.5" (1.74 m)   Wt 209 lb (94.8 kg)   BMI 31.32 kg/m  Gen:  WD/WN, NAD Head: [place]/AT, No temporalis wasting.  Ear/Nose/Throat: Hearing grossly intact, dentition  Good Eyes: Sclera non-icteric. Conjunctiva clear Neck: Supple. Trachea midline Pulmonary:  Good air movement, no use of accessory muscles, respirations not labored.  Cardiac: RRR, No JVD Vascular: Varicosities diffuse and measuring up to 3 mm in the right lower extremity        Varicosities diffuse and measuring up to 3-4 mm in the left lower extremity Vessel Right Left  Radial Palpable Palpable  Ulnar Palpable Palpable  Brachial Palpable Palpable  Carotid Palpable, without bruit Palpable, without bruit  Aorta Not palpable N/A  Femoral Palpable Palpable  Popliteal Palpable Palpable  PT Palpable Palpable  DP Palpable Palpable   Gastrointestinal: soft, non-tender/non-distended.  Musculoskeletal: M/S 5/5 throughout.   Trace  BLE edema today Neurologic: Sensation grossly intact in extremities.  Symmetrical.  Speech is fluent.  Psychiatric: Judgment intact, Mood & affect appropriate for pt's clinical situation. Dermatologic: No rashes or ulcers noted.  No cellulitis or open wounds.    Radiology No results found.  Labs No results found for this or any previous visit (from the past 2160 hour(s)).  Assessment/Plan:  Swelling of limb Present despite conservative measures. See treatment plan as below  Varicose veins of leg with pain, bilateral Present despite conservative measures. See treatment plan as below.   The patient has done their best to comply with conservative therapy of 20-30 mm Hg compression stockings, leg elevation, exercise, and anti-inflammatories as needed for discomfort.  Despite this, they continue to have daily and persistent symptoms from their venous disease.  A venous reflux study demonstrates bilateral GSV reflux as well as reflux in the deep venous system.  As such, the patient is likely to benefit from endovenous laser ablation of the GSV bilaterally, but she should still continue to wear her stockings and elevate her legs.  Risks and benefits of the procedure  including bleeding, infection, recanalization, DVT, and need for further therapy for residual varicosities were discussed.  The patient voices their understanding and is agreeable to proceed with bilateral great saphenous vein laser ablations in a staged fashion.     Festus BarrenJason Alonzo Owczarzak 01/03/2017, 4:17 PM

## 2017-01-27 ENCOUNTER — Ambulatory Visit (INDEPENDENT_AMBULATORY_CARE_PROVIDER_SITE_OTHER): Payer: BLUE CROSS/BLUE SHIELD | Admitting: Vascular Surgery

## 2017-01-27 ENCOUNTER — Encounter (INDEPENDENT_AMBULATORY_CARE_PROVIDER_SITE_OTHER): Payer: BLUE CROSS/BLUE SHIELD

## 2017-03-28 ENCOUNTER — Encounter: Payer: Self-pay | Admitting: Podiatry

## 2017-04-01 NOTE — Telephone Encounter (Signed)
Please tell patient to work on exercises at home, such as grabbing marbles or towels with the toes to work on flexion.  Also tell her that swelling in the toes can drag on for several months after surgery. Completely normal.  Thanks, Dr. Logan BoresEvans

## 2017-05-05 ENCOUNTER — Encounter (INDEPENDENT_AMBULATORY_CARE_PROVIDER_SITE_OTHER): Payer: Self-pay | Admitting: Vascular Surgery

## 2017-05-05 ENCOUNTER — Ambulatory Visit (INDEPENDENT_AMBULATORY_CARE_PROVIDER_SITE_OTHER): Payer: BLUE CROSS/BLUE SHIELD | Admitting: Vascular Surgery

## 2017-05-05 VITALS — BP 116/82 | HR 69 | Resp 15 | Ht 68.0 in | Wt 210.0 lb

## 2017-05-05 DIAGNOSIS — I83811 Varicose veins of right lower extremities with pain: Secondary | ICD-10-CM

## 2017-05-05 DIAGNOSIS — I83813 Varicose veins of bilateral lower extremities with pain: Secondary | ICD-10-CM

## 2017-05-05 NOTE — Progress Notes (Signed)
Varicose veins of leg with pain, bilateral     The patient's right lower extremity was sterilely prepped and draped. The ultrasound machine was used to visualize the saphenous vein throughout its course. A segment around the knee was selected for access. The saphenous vein was accessed without difficulty using ultrasound guidance with a micro puncture needle. A micro puncture wire and sheath were then placed. A 0.018 wire was placed beyond the saphenofemoral junction through the sheath and the micro puncture sheath was removed. The 65 cm sheath was then placed over the wire and the wire and dilator were removed. The laser fiber was placed through the sheath and its tip was placed approximately 4-5 cm below the saphenofemoral junction. Tumescent anesthesia was then created with a dilute lidocaine solution. Laser energy was then delivered with constant withdrawal of the sheath and laser fiber. Approximately 1142 Joules of energy were delivered over a length of 33 cm using the 1470 Hz VenaCure machine at [employer]. Sterile dressings were placed. The patient tolerated the procedure well without complications.

## 2017-05-09 ENCOUNTER — Ambulatory Visit (INDEPENDENT_AMBULATORY_CARE_PROVIDER_SITE_OTHER): Payer: BLUE CROSS/BLUE SHIELD

## 2017-05-09 DIAGNOSIS — I83813 Varicose veins of bilateral lower extremities with pain: Secondary | ICD-10-CM | POA: Diagnosis not present

## 2017-05-17 ENCOUNTER — Encounter (INDEPENDENT_AMBULATORY_CARE_PROVIDER_SITE_OTHER): Payer: Self-pay | Admitting: Vascular Surgery

## 2017-05-19 ENCOUNTER — Ambulatory Visit (INDEPENDENT_AMBULATORY_CARE_PROVIDER_SITE_OTHER): Payer: BLUE CROSS/BLUE SHIELD | Admitting: Vascular Surgery

## 2017-05-19 ENCOUNTER — Encounter (INDEPENDENT_AMBULATORY_CARE_PROVIDER_SITE_OTHER): Payer: Self-pay | Admitting: Vascular Surgery

## 2017-05-19 VITALS — BP 113/79 | HR 82 | Resp 14 | Ht 68.5 in | Wt 213.0 lb

## 2017-05-19 DIAGNOSIS — M7989 Other specified soft tissue disorders: Secondary | ICD-10-CM

## 2017-05-19 DIAGNOSIS — I83812 Varicose veins of left lower extremities with pain: Secondary | ICD-10-CM

## 2017-05-19 DIAGNOSIS — I83813 Varicose veins of bilateral lower extremities with pain: Secondary | ICD-10-CM

## 2017-05-19 NOTE — Progress Notes (Signed)
Varicose veins of leg with pain, bilateral     The patient's left lower extremity was sterilely prepped and draped. The ultrasound machine was used to visualize the saphenous vein throughout its course. A segment in the upper calf was selected for access. The saphenous vein was accessed without difficulty using ultrasound guidance with a micro puncture needle. A micro puncture wire and sheath were then placed. A 0.018 wire was placed beyond the saphenofemoral junction through the sheath and the micro puncture sheath was removed. The 65 cm sheath was then placed over the wire and the wire and dilator were removed. The laser fiber was placed through the sheath and its tip was placed approximately 4-5 cm below the saphenofemoral junction. Tumescent anesthesia was then created with a dilute lidocaine solution. Laser energy was then delivered with constant withdrawal of the sheath and laser fiber. Approximately 1493 Joules of energy were delivered over a length of 39 cm using the 1470 Hz VenaCure machine at [employer]. Sterile dressings were placed. The patient tolerated the procedure well without complications.

## 2017-05-23 ENCOUNTER — Ambulatory Visit (INDEPENDENT_AMBULATORY_CARE_PROVIDER_SITE_OTHER): Payer: BLUE CROSS/BLUE SHIELD

## 2017-05-23 DIAGNOSIS — I83813 Varicose veins of bilateral lower extremities with pain: Secondary | ICD-10-CM | POA: Diagnosis not present

## 2017-06-08 ENCOUNTER — Encounter (INDEPENDENT_AMBULATORY_CARE_PROVIDER_SITE_OTHER): Payer: Self-pay | Admitting: Vascular Surgery

## 2017-06-08 ENCOUNTER — Ambulatory Visit (INDEPENDENT_AMBULATORY_CARE_PROVIDER_SITE_OTHER): Payer: BLUE CROSS/BLUE SHIELD | Admitting: Vascular Surgery

## 2017-06-08 VITALS — BP 122/85 | HR 71 | Resp 15 | Ht 68.5 in | Wt 211.0 lb

## 2017-06-08 DIAGNOSIS — I83813 Varicose veins of bilateral lower extremities with pain: Secondary | ICD-10-CM

## 2017-06-08 DIAGNOSIS — I872 Venous insufficiency (chronic) (peripheral): Secondary | ICD-10-CM

## 2017-06-08 NOTE — Progress Notes (Signed)
Subjective:    Patient ID: KRISTOPHER DELK, female    DOB: 1976-05-07, 41 y.o.   MRN: [id number] Chief Complaint  Patient presents with  . Follow-up    2-3 week follow up   Patient presents for first post procedure follow-up. The patient is status post bilateral greater saphenous vein ablation. Patient presents today with continued pain to the bilateral lower extremity varicosities. Patient continues to engage in conservative therapy. The patient wears medical grade 1 compression stockings, elevates her legs remains active with minimal relief in symptoms requiring the use of over-the-counter anti-inflammatories. The pain associated with her bilateral lower extremity varicosities have not improved status post greater saphenous vein ablation. Her discomfort has progressed the point she is unable to function on a daily basis. She denies any claudication-like symptoms rest pain or ulcerations or lower extremity. Patient denies any fever, nausea or vomiting. Has been minimal improvement in the patient's lower extremity edema as well.   Review of Systems  Constitutional: Negative.   HENT: Negative.   Eyes: Negative.   Respiratory: Negative.   Cardiovascular: Positive for leg swelling.       Painful varicose veins  Gastrointestinal: Negative.   Endocrine: Negative.   Genitourinary: Negative.   Musculoskeletal: Negative.   Skin: Negative.   Allergic/Immunologic: Negative.   Neurological: Negative.   Hematological: Negative.   Psychiatric/Behavioral: Negative.       Objective:   Physical Exam  Constitutional: She is oriented to person, place, and time. She appears well-developed and well-nourished. No distress.  HENT:  Head: Normocephalic and atraumatic.  Eyes: Pupils are equal, round, and reactive to light. Conjunctivae are normal.  Neck: Normal range of motion.  Cardiovascular: Normal rate, regular rhythm, normal heart sounds and intact distal pulses.   Pulses:      Radial pulses  are 2+ on the right side, and 2+ on the left side.       Dorsalis pedis pulses are 2+ on the right side, and 2+ on the left side.       Posterior tibial pulses are 2+ on the right side, and 2+ on the left side.  Pulmonary/Chest: Effort normal.  Musculoskeletal: Normal range of motion. Edema: mild bilateral lower extremity edema.  Neurological: She is alert and oriented to person, place, and time.  Skin: She is not diaphoretic.  Less than 1 cm diffuse varicosities noted to the bilateral lower extremity. There is no stasis dermatitis. There is no cellulitis. Skin is intact  Psychiatric: She has a normal mood and affect. Her behavior is normal. Judgment and thought content normal.  Vitals reviewed.  BP 122/85 (BP Location: Right Arm)   Pulse 71   Resp 15   Ht 5' 8.5" (1.74 m)   Wt 211 lb (95.7 kg)   BMI 31.62 kg/m   No past medical history on file.  Social History   Social History  . Marital status: Married    Spouse name: N/A  . Number of children: N/A  . Years of education: N/A   Occupational History  . Not on file.   Social History Main Topics  . Smoking status: Never Smoker  . Smokeless tobacco: Never Used  . Alcohol use No  . Drug use: No  . Sexual activity: Not on file   Other Topics Concern  . Not on file   Social History Narrative  . No narrative on file   Past Surgical History:  Procedure Laterality Date  . BREAST REDUCTION SURGERY Bilateral   .  CHOLECYSTECTOMY    . tummy tuck     Family History  Problem Relation Age of Onset  . Hyperlipidemia Mother   . Hypertension Mother   . Varicose Veins Mother   . Heart attack Father   . Hyperlipidemia Father   . Hypertension Father   . Hyperlipidemia Maternal Grandmother   . Hypertension Maternal Grandmother   . Varicose Veins Maternal Grandmother   . Congestive Heart Failure Maternal Grandfather   . Hyperlipidemia Maternal Grandfather   . Hypertension Maternal Grandfather    No Known Allergies       Assessment & Plan:  Patient presents for first post procedure follow-up. The patient is status post bilateral greater saphenous vein ablation. Patient presents today with continued pain to the bilateral lower extremity varicosities. Patient continues to engage in conservative therapy. The patient wears medical grade 1 compression stockings, elevates her legs remains active with minimal relief in symptoms requiring the use of over-the-counter anti-inflammatories. The pain associated with her bilateral lower extremity varicosities have not improved status post greater saphenous vein ablation. Her discomfort has progressed the point she is unable to function on a daily basis. She denies any claudication-like symptoms rest pain or ulcerations or lower extremity. Patient denies any fever, nausea or vomiting. Has been minimal improvement in the patient's lower extremity edema as well.  1. Varicose veins of leg with pain, bilateral - Stable Patient is status post bilateral greater saphenous vein ablation. She presents today with continued pain to the bilateral lower varicose veins Her discomfort has progressive the point she is unable to function on a daily basis Patient continues to engage in conservative therapy with minimal improvement He shouldn't would greatly benefit from saline sclerotherapy to the bilateral lower extremity varicose veins. I will apply to her insurance  2. Chronic venous insufficiency - Stable As above  Current Outpatient Prescriptions on File Prior to Visit  Medication Sig Dispense Refill  . ALPRAZolam (XANAX) 0.5 MG tablet     . calcium-vitamin D (SM CALCIUM 500/VITAMIN D3) 500-400 MG-UNIT tablet Take by mouth.    . docusate sodium (COLACE) 100 MG capsule Take 100 mg by mouth 2 (two) times daily.    . finasteride (PROSCAR) 5 MG tablet     . fluticasone (FLONASE) 50 MCG/ACT nasal spray     . glucosamine-chondroitin 500-400 MG tablet Take 1 tablet by mouth daily.    .  meloxicam (MOBIC) 15 MG tablet Take 15 mg by mouth daily.    . NONFORMULARY OR COMPOUNDED ITEM See pharmacy note 120 each 2  . oxyCODONE-acetaminophen (PERCOCET/ROXICET) 5-325 MG tablet Take 1 tablet by mouth every 4 (four) hours as needed for severe pain.    Marland Kitchen. spironolactone (ALDACTONE) 100 MG tablet     . tretinoin (ALTRALIN) 0.05 % gel      No current facility-administered medications on file prior to visit.     There are no Patient Instructions on file for this visit. No Follow-up on file.   Maralyn Witherell A Jaheim Canino, PA-C

## 2017-07-04 ENCOUNTER — Ambulatory Visit (INDEPENDENT_AMBULATORY_CARE_PROVIDER_SITE_OTHER): Payer: BLUE CROSS/BLUE SHIELD | Admitting: Vascular Surgery

## 2017-07-04 ENCOUNTER — Encounter (INDEPENDENT_AMBULATORY_CARE_PROVIDER_SITE_OTHER): Payer: Self-pay | Admitting: Vascular Surgery

## 2017-07-04 VITALS — BP 132/77 | HR 74 | Resp 17 | Wt 211.0 lb

## 2017-07-04 DIAGNOSIS — I83813 Varicose veins of bilateral lower extremities with pain: Secondary | ICD-10-CM

## 2017-07-04 DIAGNOSIS — I872 Venous insufficiency (chronic) (peripheral): Secondary | ICD-10-CM | POA: Diagnosis not present

## 2017-07-04 DIAGNOSIS — I83812 Varicose veins of left lower extremities with pain: Secondary | ICD-10-CM | POA: Diagnosis not present

## 2017-07-04 NOTE — Progress Notes (Signed)
Varicose veins of left lower extremity with inflammation (454.1  I83.10) Current Plans   Indication: Patient presents with symptomatic varicose veins of the left lower extremity.   Procedure: Sclerotherapy using hypertonic saline mixed with 1% Lidocaine was performed on the left lower extremity. Compression wraps were placed. The patient tolerated the procedure well. 

## 2017-07-18 ENCOUNTER — Ambulatory Visit (INDEPENDENT_AMBULATORY_CARE_PROVIDER_SITE_OTHER): Payer: BLUE CROSS/BLUE SHIELD | Admitting: Vascular Surgery

## 2017-07-18 ENCOUNTER — Encounter (INDEPENDENT_AMBULATORY_CARE_PROVIDER_SITE_OTHER): Payer: Self-pay | Admitting: Vascular Surgery

## 2017-07-18 VITALS — BP 117/77 | HR 86 | Resp 18 | Wt 212.6 lb

## 2017-07-18 DIAGNOSIS — I83811 Varicose veins of right lower extremities with pain: Secondary | ICD-10-CM

## 2017-07-18 DIAGNOSIS — I83813 Varicose veins of bilateral lower extremities with pain: Secondary | ICD-10-CM

## 2017-07-18 NOTE — Progress Notes (Signed)
Varicose veins of right  lower extremity with inflammation (454.1  I83.10) Current Plans   Indication: Patient presents with symptomatic varicose veins of the right  lower extremity.   Procedure: Sclerotherapy using hypertonic saline mixed with 1% Lidocaine was performed on the right lower extremity. Compression wraps were placed. The patient tolerated the procedure well. 

## 2017-07-21 ENCOUNTER — Encounter (INDEPENDENT_AMBULATORY_CARE_PROVIDER_SITE_OTHER): Payer: Self-pay | Admitting: Vascular Surgery

## 2017-08-01 ENCOUNTER — Ambulatory Visit (INDEPENDENT_AMBULATORY_CARE_PROVIDER_SITE_OTHER): Payer: BLUE CROSS/BLUE SHIELD | Admitting: Vascular Surgery

## 2017-09-05 ENCOUNTER — Encounter (INDEPENDENT_AMBULATORY_CARE_PROVIDER_SITE_OTHER): Payer: Self-pay | Admitting: Vascular Surgery

## 2017-09-05 ENCOUNTER — Ambulatory Visit (INDEPENDENT_AMBULATORY_CARE_PROVIDER_SITE_OTHER): Payer: BLUE CROSS/BLUE SHIELD | Admitting: Vascular Surgery

## 2017-09-05 VITALS — BP 115/77 | HR 84 | Resp 18 | Wt 211.0 lb

## 2017-09-05 DIAGNOSIS — I83811 Varicose veins of right lower extremities with pain: Secondary | ICD-10-CM | POA: Diagnosis not present

## 2017-09-05 DIAGNOSIS — I83813 Varicose veins of bilateral lower extremities with pain: Secondary | ICD-10-CM

## 2017-09-05 DIAGNOSIS — I83812 Varicose veins of left lower extremities with pain: Secondary | ICD-10-CM | POA: Diagnosis not present

## 2017-09-05 DIAGNOSIS — I872 Venous insufficiency (chronic) (peripheral): Secondary | ICD-10-CM

## 2017-09-05 NOTE — Progress Notes (Signed)
Varicose veins of bilateral  lower extremity with inflammation (454.1  I83.10) Current Plans   Indication: Patient presents with symptomatic varicose veins of the bilateral  lower extremity.   Procedure: Sclerotherapy using hypertonic saline mixed with 1% Lidocaine was performed on the bilateral lower extremity. Compression wraps were placed. The patient tolerated the procedure well. 

## 2017-10-17 ENCOUNTER — Ambulatory Visit (INDEPENDENT_AMBULATORY_CARE_PROVIDER_SITE_OTHER): Payer: BLUE CROSS/BLUE SHIELD | Admitting: Vascular Surgery

## 2017-10-17 ENCOUNTER — Encounter (INDEPENDENT_AMBULATORY_CARE_PROVIDER_SITE_OTHER): Payer: Self-pay | Admitting: Vascular Surgery

## 2017-10-17 VITALS — BP 113/80 | HR 76 | Resp 15 | Ht 68.5 in | Wt 215.0 lb

## 2017-10-17 DIAGNOSIS — I83813 Varicose veins of bilateral lower extremities with pain: Secondary | ICD-10-CM | POA: Diagnosis not present

## 2017-10-17 DIAGNOSIS — I872 Venous insufficiency (chronic) (peripheral): Secondary | ICD-10-CM

## 2017-10-17 NOTE — Progress Notes (Signed)
Varicose veins of bilateral  lower extremity with inflammation (454.1  I83.10) Current Plans   Indication: Patient presents with symptomatic varicose veins of the bilateral  lower extremity.   Procedure: Sclerotherapy using hypertonic saline mixed with 1% Lidocaine was performed on the bilateral lower extremity. Compression wraps were placed. The patient tolerated the procedure well.
